# Patient Record
Sex: Male | Born: 1970 | Race: White | Hispanic: No | Marital: Single | Smoking: Former smoker
Health system: Southern US, Community
[De-identification: ages and names within clinical notes are randomized; demographics above are authoritative.]

## PROBLEM LIST (undated history)

## (undated) DIAGNOSIS — I10 Essential (primary) hypertension: Secondary | ICD-10-CM

## (undated) DIAGNOSIS — Z86718 Personal history of other venous thrombosis and embolism: Secondary | ICD-10-CM

---

## 2014-06-21 ENCOUNTER — Other Ambulatory Visit: Payer: Self-pay | Admitting: Orthopedic Surgery

## 2014-06-21 DIAGNOSIS — M545 Low back pain: Secondary | ICD-10-CM

## 2014-06-26 ENCOUNTER — Ambulatory Visit
Admission: RE | Admit: 2014-06-26 | Discharge: 2014-06-26 | Disposition: A | Discharge status: Home / Self Care | Payer: Worker's Compensation | Source: Ambulatory Visit | Attending: Orthopedic Surgery | Admitting: Orthopedic Surgery

## 2014-06-26 DIAGNOSIS — M545 Low back pain: Secondary | ICD-10-CM

## 2014-11-20 ENCOUNTER — Emergency Department (HOSPITAL_COMMUNITY): Payer: Worker's Compensation

## 2014-11-20 ENCOUNTER — Encounter (HOSPITAL_COMMUNITY): Payer: Self-pay | Admitting: Emergency Medicine

## 2014-11-20 ENCOUNTER — Inpatient Hospital Stay (HOSPITAL_COMMUNITY)
Admission: EM | Admit: 2014-11-20 | Discharge: 2014-11-22 | DRG: 176 | Disposition: A | Discharge status: Home / Self Care | Payer: Worker's Compensation | Attending: Internal Medicine | Admitting: Internal Medicine

## 2014-11-20 DIAGNOSIS — I82512 Chronic embolism and thrombosis of left femoral vein: Secondary | ICD-10-CM | POA: Diagnosis present

## 2014-11-20 DIAGNOSIS — F17210 Nicotine dependence, cigarettes, uncomplicated: Secondary | ICD-10-CM | POA: Diagnosis present

## 2014-11-20 DIAGNOSIS — F1910 Other psychoactive substance abuse, uncomplicated: Secondary | ICD-10-CM

## 2014-11-20 DIAGNOSIS — I10 Essential (primary) hypertension: Secondary | ICD-10-CM | POA: Diagnosis present

## 2014-11-20 DIAGNOSIS — Z7901 Long term (current) use of anticoagulants: Secondary | ICD-10-CM

## 2014-11-20 DIAGNOSIS — I82431 Acute embolism and thrombosis of right popliteal vein: Secondary | ICD-10-CM | POA: Diagnosis present

## 2014-11-20 DIAGNOSIS — G629 Polyneuropathy, unspecified: Secondary | ICD-10-CM | POA: Diagnosis present

## 2014-11-20 DIAGNOSIS — Z720 Tobacco use: Secondary | ICD-10-CM | POA: Diagnosis present

## 2014-11-20 DIAGNOSIS — I82403 Acute embolism and thrombosis of unspecified deep veins of lower extremity, bilateral: Secondary | ICD-10-CM

## 2014-11-20 DIAGNOSIS — I2699 Other pulmonary embolism without acute cor pulmonale: Secondary | ICD-10-CM

## 2014-11-20 DIAGNOSIS — I82409 Acute embolism and thrombosis of unspecified deep veins of unspecified lower extremity: Secondary | ICD-10-CM | POA: Diagnosis present

## 2014-11-20 DIAGNOSIS — I82411 Acute embolism and thrombosis of right femoral vein: Secondary | ICD-10-CM | POA: Diagnosis present

## 2014-11-20 DIAGNOSIS — I2692 Saddle embolus of pulmonary artery without acute cor pulmonale: Secondary | ICD-10-CM

## 2014-11-20 DIAGNOSIS — I82491 Acute embolism and thrombosis of other specified deep vein of right lower extremity: Secondary | ICD-10-CM | POA: Diagnosis present

## 2014-11-20 HISTORY — DX: Essential (primary) hypertension: I10

## 2014-11-20 HISTORY — DX: Personal history of other venous thrombosis and embolism: Z86.718

## 2014-11-20 LAB — D-DIMER, QUANTITATIVE (NOT AT ARMC): D DIMER QUANT: 15.35 ug{FEU}/mL — AB (ref 0.00–0.48)

## 2014-11-20 LAB — BASIC METABOLIC PANEL
ANION GAP: 13 (ref 5–15)
BUN: 7 mg/dL (ref 6–23)
CALCIUM: 8.3 mg/dL — AB (ref 8.4–10.5)
CO2: 20 mmol/L (ref 19–32)
Chloride: 106 mEq/L (ref 96–112)
Creatinine, Ser: 0.82 mg/dL (ref 0.50–1.35)
GFR calc non Af Amer: >90 mL/min (ref >90)
Glucose, Bld: 87 mg/dL (ref 70–99)
POTASSIUM: 3.6 mmol/L (ref 3.5–5.1)
Sodium: 139 mmol/L (ref 135–145)

## 2014-11-20 LAB — CBC
HCT: 41.6 % (ref 39.0–52.0)
Hemoglobin: 14.4 g/dL (ref 13.0–17.0)
MCH: 33.1 pg (ref 26.0–34.0)
MCHC: 34.6 g/dL (ref 30.0–36.0)
MCV: 95.6 fL (ref 78.0–100.0)
PLATELETS: 123 10*3/uL — AB (ref 150–400)
RBC: 4.35 MIL/uL (ref 4.22–5.81)
RDW: 14 % (ref 11.5–15.5)
WBC: 5.9 10*3/uL (ref 4.0–10.5)

## 2014-11-20 LAB — APTT: APTT: 28 s (ref 24–37)

## 2014-11-20 LAB — PROTIME-INR
INR: 0.97 (ref 0.00–1.49)
Prothrombin Time: 13 seconds (ref 11.6–15.2)

## 2014-11-20 MED ORDER — DOXAZOSIN MESYLATE 1 MG PO TABS
1.0000 mg | ORAL_TABLET | Freq: Every day | ORAL | Status: DC
  Administered 2014-11-20 – 2014-11-22 (×3): 1 mg via ORAL
  Filled 2014-11-20 (×3): qty 1

## 2014-11-20 MED ORDER — HEPARIN (PORCINE) IN NACL 100-0.45 UNIT/ML-% IJ SOLN
1450.0000 [IU]/h | INTRAMUSCULAR | Status: AC
  Administered 2014-11-20 (×2): 1300 [IU]/h via INTRAVENOUS
  Administered 2014-11-21: 1450 [IU]/h via INTRAVENOUS
  Filled 2014-11-20 (×6): qty 250

## 2014-11-20 MED ORDER — PREGABALIN 75 MG PO CAPS
150.0000 mg | ORAL_CAPSULE | Freq: Every day | ORAL | Status: DC
  Administered 2014-11-20 – 2014-11-21 (×2): 150 mg via ORAL
  Filled 2014-11-20 (×2): qty 2

## 2014-11-20 MED ORDER — SODIUM CHLORIDE 0.9 % IV SOLN: 250.0000 mL | INTRAVENOUS | Status: DC | PRN

## 2014-11-20 MED ORDER — SODIUM CHLORIDE 0.9 % IJ SOLN: 3.0000 mL | INTRAMUSCULAR | Status: DC | PRN

## 2014-11-20 MED ORDER — ACETAMINOPHEN 650 MG RE SUPP: 650.0000 mg | Freq: Four times a day (QID) | RECTAL | Status: DC | PRN

## 2014-11-20 MED ORDER — ACETAMINOPHEN 325 MG PO TABS: 650.0000 mg | ORAL_TABLET | Freq: Four times a day (QID) | ORAL | Status: DC | PRN

## 2014-11-20 MED ORDER — SODIUM CHLORIDE 0.9 % IJ SOLN
3.0000 mL | Freq: Two times a day (BID) | INTRAMUSCULAR | Status: DC
  Administered 2014-11-20 – 2014-11-22 (×3): 3 mL via INTRAVENOUS

## 2014-11-20 MED ORDER — ENOXAPARIN SODIUM 80 MG/0.8ML ~~LOC~~ SOLN
80.0000 mg | Freq: Two times a day (BID) | SUBCUTANEOUS | Status: DC
  Filled 2014-11-20: qty 0.8

## 2014-11-20 MED ORDER — INFLUENZA VAC SPLIT QUAD 0.5 ML IM SUSY: 0.5000 mL | PREFILLED_SYRINGE | INTRAMUSCULAR | Status: DC | PRN

## 2014-11-20 MED ORDER — IOHEXOL 350 MG/ML SOLN
100.0000 mL | Freq: Once | INTRAVENOUS | Status: AC | PRN
  Administered 2014-11-20: 100 mL via INTRAVENOUS

## 2014-11-20 MED ORDER — SODIUM CHLORIDE 0.9 % IJ SOLN
3.0000 mL | Freq: Two times a day (BID) | INTRAMUSCULAR | Status: DC
Start: 2014-11-20 — End: 2014-11-22
  Administered 2014-11-20 – 2014-11-22 (×2): 3 mL via INTRAVENOUS

## 2014-11-20 MED ORDER — ENOXAPARIN SODIUM 80 MG/0.8ML ~~LOC~~ SOLN
80.0000 mg | Freq: Once | SUBCUTANEOUS | Status: AC
  Administered 2014-11-20: 80 mg via SUBCUTANEOUS
  Filled 2014-11-20: qty 0.8

## 2014-11-20 MED ORDER — MORPHINE SULFATE 2 MG/ML IJ SOLN
2.0000 mg | INTRAMUSCULAR | Status: DC | PRN
  Administered 2014-11-20 – 2014-11-22 (×10): 2 mg via INTRAVENOUS
  Filled 2014-11-20 (×10): qty 1

## 2014-11-20 NOTE — Progress Notes (Signed)
ANTICOAGULATION CONSULT NOTE - Initial Consult  Pharmacy Consult for Heparin Indication: pulmonary embolus  No Known Allergies  Patient Measurements: Height: 6' (182.9 cm) Weight: 166 lb (75.297 kg) IBW/kg (Calculated) : 77.6 Heparin Dosing Weight: 75.3 kg  Vital Signs: Temp: 98.5 F (36.9 C) (12/22 1313) Temp Source: Oral (12/22 1313) BP: 138/83 mmHg (12/22 1230) Pulse Rate: 85 (12/22 1230)  Labs:  Recent Labs  11/20/14 1002  HGB 14.4  HCT 41.6  PLT 123*  APTT 28  LABPROT 13.0  INR 0.97  CREATININE 0.82    Estimated Creatinine Clearance: 121.2 mL/min (by C-G formula based on Cr of 0.82).   Medical History: Past Medical History  Diagnosis Date  . H/O blood clots   . Hypertension     stated that he has hx of HTN but they d/c the bp medication because BP was fine.     Medications:   (Not in a hospital admission) Scheduled:  . [START ON 11/21/2014] enoxaparin (LOVENOX) injection  80 mg Subcutaneous Q12H    Assessment: 43yo male with history of DVT in LLE presents with leg pain and SOB. Pharmacy is consulted to dose heparin for saddle pulmonary embolus. Pt received one dose of therapeutic lovenox at 1100 before the decision to transition to heparin. Hgb 14.4, Plt 123, sCr 0.82. There is no noted bleeding.  Goal of Therapy:  Heparin level 0.3-0.7 units/ml Monitor platelets by anticoagulation protocol: Yes   Plan:  Omit heparin bolus due to initial enoxaparin dose Start heparin infusion at 1300 units/hr Check anti-Xa level in 6 hours and daily while on heparin Continue to monitor H&H and platelets  Arlean Hoppingorey M. Newman PiesBall, PharmD Clinical Pharmacist Pager [phone] 11/20/2014,2:51 PM

## 2014-11-20 NOTE — ED Notes (Signed)
Patient transported to x-ray. ?

## 2014-11-20 NOTE — ED Provider Notes (Signed)
CSN: [id number]     Arrival date & time 11/20/14  0940 History   First MD Initiated Contact with Patient 11/20/14 [phone]     Chief Complaint  Patient presents with  . Leg Pain  . Leg Swelling  . Shortness of Breath     (Consider location/radiation/quality/duration/timing/severity/associated sxs/prior Treatment) HPI 43 year old male presents today complaining of right lower extremity swelling that began 3-4 days ago. He has a history of a DVT in the left lower extremity. He states this was diagnosed in May and that he was treated for 6 months with .  He states that this was stopped after 6 months but states that it was stopped in September, hence the dates are somewhat unclear. He states that that DVT began after he had an injury although he describes it as occurring the next day. He denies any family history of clots or prior history of clots. He states he does have some shortness of breath today with exertion. He denies any chest pain. He has not had any new injury to his lower extremities. Past Medical History  Diagnosis Date  . H/O blood clots   . Hypertension     stated that he has hx of HTN but they d/c the bp medication because BP was fine.    History reviewed. No pertinent past surgical history. No family history on file. History  Substance Use Topics  . Smoking status: Current Every Day Smoker -- 1.50 packs/day    Types: Cigarettes  . Smokeless tobacco: Not on file  . Alcohol Use: No    Review of Systems  All other systems reviewed and are negative.     Allergies  Review of patient's allergies indicates no known allergies.  Home Medications   Prior to Admission medications   Not on File   BP 160/95 mmHg  Pulse 95  Temp(Src) 98.9 F (37.2 C) (Oral)  Resp 16  SpO2 96% Physical Exam  Constitutional: He is oriented to person, place, and time. He appears well-developed and well-nourished.  HENT:  Head: Normocephalic and atraumatic.  Right Ear: External ear  normal.  Left Ear: External ear normal.  Nose: Nose normal.  Mouth/Throat: Oropharynx is clear and moist.  Eyes: Conjunctivae and EOM are normal. Pupils are equal, round, and reactive to light.  Neck: Normal range of motion. Neck supple.  Cardiovascular: Normal rate, regular rhythm, normal heart sounds and intact distal pulses.   Pulmonary/Chest: Effort normal and breath sounds normal. No respiratory distress. He has no wheezes. He exhibits no tenderness.  Abdominal: Soft. Bowel sounds are normal. He exhibits no distension and no mass. There is no tenderness. There is no guarding.  Musculoskeletal: Normal range of motion.       Legs: rle swelling   Neurological: He is alert and oriented to person, place, and time. He has normal reflexes. He exhibits normal muscle tone. Coordination normal.  Skin: Skin is warm and dry.  Psychiatric: He has a normal mood and affect. His behavior is normal. Judgment and thought content normal.  Nursing note and vitals reviewed.   ED Course  Procedures (including critical care time) Labs Review Labs Reviewed  BASIC METABOLIC PANEL - Abnormal; Notable for the following:    Calcium 8.3 (*)    All other components within normal limits  CBC - Abnormal; Notable for the following:    Platelets 123 (*)    All other components within normal limits  D-DIMER, QUANTITATIVE - Abnormal; Notable for the following:  D-Dimer, Quant 15.35 (*)    All other components within normal limits  APTT  PROTIME-INR    Imaging Review No results found.   EKG Interpretation   Date/Time:  Tuesday November 20 2014 10:51:47 EST Ventricular Rate:  80 PR Interval:  149 QRS Duration: 90 QT Interval:  363 QTC Calculation: 419 R Axis:   69 Text Interpretation:  Normal sinus rhythm Normal ECG Confirmed by Kajsa Butrum MD,  Duwayne HeckANIELLE ([id number]) on 11/20/2014 11:58:20 AM      MDM   Final diagnoses:  Saddle pulmonary embolus  DVT (deep venous thrombosis), bilateral  Bilateral  pulmonary embolism    CT chest report called to me by radiologist who reports bilateral PE with right heart strain noted. Reexamination of the patient reveals patient awake and alert without dyspnea at rest. Heart rate is 85 respiratory rate 17 blood pressure 138/83. Patient had received Lovenox 1 mg/kg on my initial evaluation for probable DVT. Care discussed with pharmacy.  Patient placed on oxygen.  Page to critical care placed.  I discussed patient's care with Dr. Molli KnockYacoub on call for critical care. He has reviewed the CT and feels that patient is candidate for telemetry bed.  Care discussed with Dr. Catha GosselinMikhail and plan admission.   Hilario Quarryanielle S Nikitha Mode, MD 11/26/14 [phone]

## 2014-11-20 NOTE — H&P (Signed)
[facility] History and Physical  Legrande Hao WUJ:[id number] DOB: 1971-02-08 DOA: 11/20/2014  Referring physician: Dr. Margarita Grizzle  PCP: No primary care provider on file.  Specialists:   Chief Complaint: Leg pain and shortness of breath  HPI: Walter Mccarty is a 43 y.o. 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 Patient had an injury in May 2015 at which point he had gone to [facility] and was found to have a left lower extremity DVT. Patient was placed on Xarelto which was discontinued in September 2015. Upon arrival to the emergency department, patient did not complain of chest pain.  He did complain of shortness of breath with exertion which began today. Patient denies any new injury, falls, recent travel. He does state for the past several months he has been more sedentary than usual.  CT chest done in the ER shows acute saddle pulmonary emboli. PCCM was called, however as patient is currently hemodynamically stable, admission was deferred to [facility].  Review of Systems:  Constitutional: Denies fever, chills, diaphoresis, appetite change and fatigue.  HEENT: Denies photophobia, eye pain, redness, hearing loss, ear pain, congestion, sore throat, rhinorrhea, sneezing, mouth sores, trouble swallowing, neck pain, neck stiffness and tinnitus.   Respiratory: Complains of some shortness of breath.   Cardiovascular: Denies chest pain, palpitations. Complains of right leg swelling.  Gastrointestinal: Denies nausea, vomiting, abdominal pain, diarrhea, constipation, blood in stool and abdominal distention.  Genitourinary: Denies dysuria, urgency, frequency, hematuria, flank pain and difficulty urinating.  Musculoskeletal: Complains of back pain and leg swelling.  Skin: Denies pallor, rash and wound.  Neurological: Denies  dizziness, seizures, syncope, weakness, light-headedness, numbness and headaches.  Hematological: Denies adenopathy. Easy bruising, personal or family bleeding history  Psychiatric/Behavioral: Denies suicidal ideation, mood changes, confusion, nervousness, sleep disturbance and agitation  Past Medical History  Diagnosis Date  . H/O blood clots   . Hypertension     stated that he has hx of HTN but they d/c the bp medication because BP was fine.    History reviewed. No pertinent past surgical history. Social History:  reports that he has been smoking Cigarettes.  He has been smoking about 1.50 packs per day. He does not have any smokeless tobacco history on file. He reports that he does not drink alcohol or use illicit drugs.   No Known Allergies  Family history Reviewed and not pertinent   Prior to Admission medications   Medication Sig Start Date End Date Taking? Authorizing Provider  doxazosin (CARDURA) 1 MG tablet Take 1 mg by mouth daily. 10/26/14  Yes Historical Provider, MD  LYRICA 75 MG capsule Take 150 mg by mouth at bedtime. 10/26/14  Yes Historical Provider, MD   Physical Exam: Filed Vitals:   11/20/14 1313  BP:   Pulse:   Temp: 98.5 F (36.9 C)  Resp:      General: Well developed, well nourished, NAD, appears stated age  HEENT: NCAT, PERRLA, EOMI, Anicteic Sclera, mucous membranes moist.   Neck: Supple, no JVD, no masses  Cardiovascular: S1 S2 auscultated, no rubs, murmurs or gallops. Regular rate and rhythm.  Respiratory: Clear to auscultation bilaterally with equal chest rise  Abdomen: Soft, nontender, nondistended, + bowel sounds  Extremities: warm dry without cyanosis clubbing. RLE edema and erythema, warm and tender to palpation  Neuro: AAOx3, cranial nerves grossly intact. Strength  equal and bilateral in patient's upper and lower extremities  Skin: Without rashes exudates or nodules  Psych: Anxious, but appropriate  Labs on Admission:  Basic  Metabolic Panel:  Recent Labs Lab 11/20/14 1002  NA 139  K 3.6  CL 106  CO2 20  GLUCOSE 87  BUN 7  CREATININE 0.82  CALCIUM 8.3*   Liver Function Tests: No results for input(s): AST, ALT, ALKPHOS, BILITOT, PROT, ALBUMIN in the last 168 hours. No results for input(s): LIPASE, AMYLASE in the last 168 hours. No results for input(s): AMMONIA in the last 168 hours. CBC:  Recent Labs Lab 11/20/14 1002  WBC 5.9  HGB 14.4  HCT 41.6  MCV 95.6  PLT 123*   Cardiac Enzymes: No results for input(s): CKTOTAL, CKMB, CKMBINDEX, TROPONINI in the last 168 hours.  BNP (last 3 results) No results for input(s): PROBNP in the last 8760 hours. CBG: No results for input(s): GLUCAP in the last 168 hours.  Radiological Exams on Admission: Ct Angio Chest Pe W/cm &/or Wo Cm  11/20/2014   CLINICAL DATA:  Right upper chest pain, severe right leg pain. Patient reports recent vascular study with DVT in right leg. History of left leg DVT May 2015.  EXAM: CT ANGIOGRAPHY CHEST WITH CONTRAST  TECHNIQUE: Multidetector CT imaging of the chest was performed using the standard protocol during bolus administration of intravenous contrast. Multiplanar CT image reconstructions and MIPs were obtained to evaluate the vascular anatomy.  CONTRAST:  100mL OMNIPAQUE IOHEXOL 350 MG/ML SOLN  COMPARISON:  None available.  FINDINGS: The examination is positive for pulmonary embolus. There is a thin saddle component with large thromboembolic burden in the distal main right pulmonary artery extending into the lower lobe and all segmental branches. Clot extends into the right upper lobar and to a lesser extent segmental branches. There is extension into the right middle lobe and medial segmental branch. On the left there is moderate thromboembolic burden involving the left upper and lower lobar arteries as well as all segmental branches. There is evidence of right heart strain with straightening of the intraventricular septum and  RV/ LV ratio of 1.5. No consolidation in the lung parenchyma to suggest pulmonary infarct.  The lungs are clear. There is no pleural or pericardial effusion. No enlarged mediastinal, hilar, or axillary lymph nodes. The thoracic aorta is normal in caliber. Minimal atherosclerotic calcifications in the proximal coronary arteries.  Evaluation of the upper abdomen demonstrates diffusely decreased hepatic density consistent with hepatic steatosis. No acute abnormality in the included upper abdomen.  No acute or suspicious osseous abnormality.  Review of the MIP images confirms the above findings.  IMPRESSION: 1. Positive for acute PE with CT evidence of right heartstrain (RV/LV Ratio = 1.5) consistent with at least submassive (intermediate risk)PE. The presence of right heart strain has been associated with an increased risk of morbidity and mortality. Consultation with Pulmonary and Critical Care Medicine is recommended. 2. Incidental finding of hepatic steatosis.  Critical Value/emergent results were called by telephone at the time of interpretation on 11/20/2014 at 12:52 pm to Dr. Margarita GrizzleANIELLE RAY , who verbally acknowledged these results.   Electronically Signed   By: Rubye OaksMelanie  Ehinger M.D.   On: 11/20/2014 13:02    EKG: Independently reviewed. SR, rate 80  Assessment/Plan  Acute pulmonary embolism/Saddle -Patient be admitted to telemetry -Patient currently hemodynamically stable, blood pressure is stable -history of Left DVT due to injury, was on xarelto for 3 months (discontinued in Sep 2015) -CT chest:  Positive for acute PE with evidence of right heart strain consistent with at least some massive PE; thin saddle component with large thromboembolic burden in distal main right PA -PCCM contacted and felt patient to be stable for telemetry -Echocardiogram ordered -Patient given Lovenox 80mg  SQ by ED -Will place patient on heparin gtt -Lower extremity Doppler: Right: Acute DVT noted in the CFV, FV, Pop v,  gastrocnemius v, and proximal PTV and Pero v. Superficial thrombosis noted in the proximal LSV. 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 -Explained to patient and mother that he will need lifelong anticoagulation  Nicotine abuse -Smoking cessation counseling given  Neuropathy -Continue lyrica  Hypertension -Continue cardura, with holding parameters  DVT prophylaxis: Heparin  Code Status: Full  Condition: Guarded  Family Communication: Mother at bedside. Admission, patients condition and plan of care including tests being ordered have been discussed with the patient and mother who indicate understanding and agree with the plan and Code Status.  Disposition Plan: Admitted   Time spent: 65 minutes  Ector Laurel D.O. [facility] Pager [phone]  If 7PM-7AM, please contact night-coverage [email or web address] Password [email or web address] 11/20/2014, 2:17 PM

## 2014-11-20 NOTE — Progress Notes (Signed)
VASCULAR LAB PRELIMINARY  PRELIMINARY  PRELIMINARY  PRELIMINARY  Bilateral lower extremity venous duplex  completed.    Preliminary report:  Right:  Acute DVT noted in the CFV, FV, Pop v, gastrocnemius v, and proximal PTV and Pero v.  Superficial thrombosis noted in the proximal LSV.  No Baker's cyst.  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  No evidence of superficial thrombosis.  No Baker's cyst.   Gabe Glace, RVT 11/20/2014, 12:31 PM

## 2014-11-20 NOTE — Progress Notes (Signed)
ANTICOAGULATION CONSULT NOTE - Initial Consult  Pharmacy Consult for lovenox Indication: DVT  No Known Allergies  Patient Measurements:    Dosing Weight: 75.3 kg  Vital Signs: Temp: 98.9 F (37.2 C) (12/22 0958) Temp Source: Oral (12/22 0958) BP: 160/95 mmHg (12/22 0958) Pulse Rate: 95 (12/22 0958)  Labs:  Recent Labs  11/20/14 1002  HGB 14.4  HCT 41.6  PLT 123*    CrCl cannot be calculated (Unknown ideal weight.).   Medical History: Past Medical History  Diagnosis Date  . H/O blood clots   . Hypertension     stated that he has hx of HTN but they d/c the bp medication because BP was fine.     Medications:  See medication history  Assessment: 43 yo man to start lovenox for DVT.  CrCl >100 ml/min.  Baseline Hg okay, PTLC low at 123 Goal of Therapy:  Anti-Xa level 0.6-1 units/ml 4hrs after LMWH dose given Monitor platelets by anticoagulation protocol: Yes   Plan:  Lovenox 80 mg sq q12 hours CBC q 3 days while on lovenox Monitor for bleeding complications. F/u start oral AC.  Thanks for allowing pharmacy to be a part of this patient's care.  Talbert CageLora Paytin Ramakrishnan, PharmD Clinical Pharmacist, [phone]  11/20/2014,10:37 AM

## 2014-11-20 NOTE — ED Notes (Addendum)
Pt arrived to ED with c/o right leg pain swelling and SOB with exertion. Pt has hx of blood clot to left leg. Pt stated that right leg hurts from ankle all the way up to groin.

## 2014-11-21 DIAGNOSIS — I517 Cardiomegaly: Secondary | ICD-10-CM

## 2014-11-21 DIAGNOSIS — I82403 Acute embolism and thrombosis of unspecified deep veins of lower extremity, bilateral: Secondary | ICD-10-CM

## 2014-11-21 LAB — CBC
HCT: 39.2 % (ref 39.0–52.0)
Hemoglobin: 13.1 g/dL (ref 13.0–17.0)
MCH: 32.2 pg (ref 26.0–34.0)
MCHC: 33.4 g/dL (ref 30.0–36.0)
MCV: 96.3 fL (ref 78.0–100.0)
Platelets: 129 10*3/uL — ABNORMAL LOW (ref 150–400)
RBC: 4.07 MIL/uL — ABNORMAL LOW (ref 4.22–5.81)
RDW: 14.2 % (ref 11.5–15.5)
WBC: 6.6 10*3/uL (ref 4.0–10.5)

## 2014-11-21 LAB — HEPARIN LEVEL (UNFRACTIONATED)
Heparin Unfractionated: 0.32 IU/mL (ref 0.30–0.70)
Heparin Unfractionated: 0.34 IU/mL (ref 0.30–0.70)

## 2014-11-21 NOTE — Care Management Note (Signed)
    Page 1 of 1   11/21/2014     11:45:51 AM CARE MANAGEMENT NOTE 11/21/2014  Patient:  Walter Mccarty,Walter Mccarty   Account Number:  [id number]  Date Initiated:  11/21/2014  Documentation initiated by:  Donn PieriniWEBSTER,Terianna Peggs  Subjective/Objective Assessment:   Pt admitted with DVT and PE     Action/Plan:   PTA pt lived at home with mother   Anticipated DC Date:  11/23/2014   Anticipated DC Plan:  HOME/SELF CARE      DC Planning Services  CM consult  Medication Assistance  [facility]      Choice offered to / List presented to:             Status of service:  In process, will continue to follow Medicare Important Message given?   (If response is "NO", the following Medicare IM given date fields will be blank) Date Medicare IM given:   Medicare IM given by:   Date Additional Medicare IM given:   Additional Medicare IM given by:    Discharge Disposition:    Per UR Regulation:  Reviewed for med. necessity/level of care/duration of stay  If discussed at Long Length of Stay Meetings, dates discussed:    Comments:  11/21/14- 1100- Donn PieriniKristi Emanual Lamountain RN, BSN [phone] Pt to start on Xarelto- per conversation with pt and mother- pt was on Xarelto previously and was taken off after 3 mo- per pt he was covered for Xarelto under his Worker's Comp. and did not use assistance under Xarelto drug company- he states he did not use the 30 day free card when he was placed on it previously - so can use the 30 day free card at time of discharge - card given to pt for use- pt also given paperwork for pt assistance for Xarelto through [employer]- pt [facility] - and faxing to see if he is eligible for pt assistance- [facility] f/u appointment made for Monday 11/26/14 at 9:00- pt has info on clinic- and understands he will need $20 copay for visit. He will f/u with clinic for establishing PCP there.

## 2014-11-21 NOTE — Progress Notes (Signed)
*  PRELIMINARY RESULTS* Echocardiogram 2D Echocardiogram has been performed.  Jeryl ColumbiaLLIOTT, Telesforo Brosnahan 11/21/2014, 9:54 AM

## 2014-11-21 NOTE — Progress Notes (Signed)
ANTICOAGULATION CONSULT NOTE - Follow Up Consult  Pharmacy Consult for Heparin Indication: New RLE DVT and acute saddle PE  No Known Allergies  Patient Measurements: Height: 6' (182.9 cm) Weight: 170 lb (77.111 kg) IBW/kg (Calculated) : 77.6 Heparin Dosing Weight: 77 kg  Vital Signs: Temp: 97.8 F (36.6 C) (12/23 0447) Temp Source: Oral (12/23 0447) BP: 121/80 mmHg (12/23 0447) Pulse Rate: 79 (12/23 0447)  Labs:  Recent Labs  11/20/14 1002 11/21/14 0256  HGB 14.4 13.1  HCT 41.6 39.2  PLT 123* 129*  APTT 28  --   LABPROT 13.0  --   INR 0.97  --   HEPARINUNFRC  --  0.32  CREATININE 0.82  --     Estimated Creatinine Clearance: 124.1 mL/min (by C-G formula based on Cr of 0.82).   Medications:  Heparin @ 1400 units/hr (14 ml/hr)  Assessment: 89 YOM with recent DVT diagnosed in May '15 treated with Xarelto who presented on 12/22 with a new DVT and acute saddle PE. Pharmacy on board to dose heparin for anticoagulation.  A heparin level this morning remained therapeutic however still on the lower end of the range (HL 0.34 << 0.32, goal of 0.3-0.7). Hgb/Hct wnl, plts 129 << 123. No overt s/sx of bleeding noted. Will increase slightly to keep within range and recheck a HL in the AM.  Goal of Therapy:  Heparin level 0.3-0.7 units/ml Monitor platelets by anticoagulation protocol: Yes   Plan:  1. Increase heparin to 1450 units/hr (14.5 ml/hr) 2. Will continue to monitor for any signs/symptoms of bleeding and will follow up with heparin level in the a.m.   Georgina PillionElizabeth Adana Marik, PharmD, BCPS Clinical Pharmacist Pager: [phone] 11/21/2014 11:21 AM

## 2014-11-21 NOTE — Progress Notes (Signed)
UR Completed.  [phone]  

## 2014-11-21 NOTE — Plan of Care (Signed)
Problem: Phase I Progression Outcomes Goal: Pain controlled with appropriate interventions Outcome: Progressing Pt request morphine q4h.

## 2014-11-21 NOTE — Progress Notes (Signed)
ANTICOAGULATION CONSULT NOTE - Follow Up Consult  Pharmacy Consult for heparin Indication: pulmonary embolus  Labs:  Recent Labs  11/20/14 1002 11/21/14 0256  HGB 14.4 13.1  HCT 41.6 39.2  PLT 123* 129*  APTT 28  --   LABPROT 13.0  --   INR 0.97  --   HEPARINUNFRC  --  0.32  CREATININE 0.82  --      Assessment: 43yo male therapeutic on heparin though at low end of goal, would prefer higher level w/ PE.  Goal of Therapy:  Heparin level 0.3-0.7 units/ml   Plan:  Will increase heparin gtt slightly to 1400 units/hr and check level in 6hr.  Vernard GamblesVeronda Reginal Wojcicki, PharmD, BCPS  11/21/2014,4:06 AM

## 2014-11-21 NOTE — Progress Notes (Signed)
[facility]                                                                              Patient Demographics  Walter LeydenJoel Mccarty, is a 43 y.o. male, DOB - 01/23/1971, ZOX:[id number]  Admit date - 11/20/2014   Admitting Physician Walter PetrinMaryann Laquandra Carrillo, DO  Outpatient Primary MD for the patient is No primary care provider on file.  LOS - 1   Chief Complaint  Patient presents with  . Leg Pain  . Leg Swelling  . Shortness of Breath        Assessment & Plan   Acute pulmonary embolism/Saddle -Patient currently hemodynamically stable, blood pressure is stable -history of Left DVT due to injury, was on xarelto for 3 months (discontinued in Sep 2015) -CT chest: Positive for acute PE with evidence of right heart strain consistent with at least some massive PE; thin saddle component with large thromboembolic burden in distal main right PA -PCCM contacted and felt patient to be stable for telemetry -Echocardiogram: EF 55-60%, diastolic function parameters normal -Patient given Lovenox 80mg  SQ by ED -Continue heparin gtt -Lower extremity Doppler: Right: Acute DVT noted in the CFV, FV, Pop v, gastrocnemius v, and proximal PTV and Pero v. Superficial thrombosis noted in the proximal LSV. 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 -Explained to patient and mother that he will need lifelong anticoagulation  Nicotine abuse -Smoking cessation counseling given  Neuropathy -Continue lyrica  Hypertension -Continue cardura, with holding parameters  Code Status: Full  Family Communication: None at bedside  Disposition Plan: Admitted, expect discharge within 24-48 hours  Time Spent in minutes   30 minutes  Procedures  Echocardiogram: EF 55-60%, diastolic function normal  Consults   PCCM  DVT Prophylaxis  Heparin  Lab Results  Component Value Date   PLT 129* 11/21/2014    Medications  Scheduled Meds: . doxazosin  1 mg Oral Daily  . pregabalin  150 mg Oral QHS  . sodium chloride  3 mL  Intravenous Q12H  . sodium chloride  3 mL Intravenous Q12H   Continuous Infusions: . heparin 1,450 Units/hr (11/21/14 1142)   PRN Meds:.sodium chloride, acetaminophen **OR** acetaminophen, Influenza vac split quadrivalent PF, morphine injection, sodium chloride  Antibiotics    Anti-infectives    None     Subjective:   Walter LeydenJoel Mccarty seen and examined today. Patient states he does not shortness of breath as long as he does not move.  He denies chest pain.  He does complain of continued leg pain, but does feel it has improved.   Objective:   Filed Vitals:   11/20/14 1529 11/20/14 2012 11/21/14 0447 11/21/14 1007  BP: 132/96 137/89 121/80 129/82  Pulse: 80 79 79   Temp: 98.4 F (36.9 C) 98.7 F (37.1 C) 97.8 F (36.6 C)   TempSrc: Oral Oral Oral   Resp: 18 18 18    Height: 6' (1.829 m)     Weight: 77.111 kg (170 lb)     SpO2: 100% 95% 95%     Wt Readings from Last 3 Encounters:  11/20/14 77.111 kg (170 lb)     Intake/Output Summary (Last 24 hours) at 11/21/14 1331 Last data filed  at 11/21/14 1011  Gross per 24 hour  Intake      3 ml  Output   1050 ml  Net  -1047 ml    Exam  General: Well developed, well nourished, NAD, appears stated age  HEENT: NCAT, mucous membranes moist.   Cardiovascular: S1 S2 auscultated, no rubs, murmurs or gallops. Regular rate and rhythm.  Respiratory: Clear to auscultation bilaterally with equal chest rise  Abdomen: Soft, nontender, nondistended, + bowel sounds  Extremities: warm dry without cyanosis clubbing. Edema RLE, with mild erythema and warmth, some TTP  Neuro: AAOx3, no focal deficits  Skin: Without rashes exudates or nodules  Psych: Normal affect and demeanor with intact judgement and insight  Data Review   Micro Results No results found for this or any previous visit (from the past 240 hour(s)).  Radiology Reports Ct Angio Chest Pe W/cm &/or Wo Cm  11/20/2014   CLINICAL DATA:  Right upper chest pain, severe  right leg pain. Patient reports recent vascular study with DVT in right leg. History of left leg DVT May 2015.  EXAM: CT ANGIOGRAPHY CHEST WITH CONTRAST  TECHNIQUE: Multidetector CT imaging of the chest was performed using the standard protocol during bolus administration of intravenous contrast. Multiplanar CT image reconstructions and MIPs were obtained to evaluate the vascular anatomy.  CONTRAST:  OMNIPAQUE IOHEXOL 350 MG/ML SOLN  COMPARISON:  None available.  FINDINGS: The examination is positive for pulmonary embolus. There is a thin saddle component with large thromboembolic burden in the distal main right pulmonary artery extending into the lower lobe and all segmental branches. Clot extends into the right upper lobar and to a lesser extent segmental branches. There is extension into the right middle lobe and medial segmental branch. On the left there is moderate thromboembolic burden involving the left upper and lower lobar arteries as well as all segmental branches. There is evidence of right heart strain with straightening of the intraventricular septum and RV/ LV ratio of 1.5. No consolidation in the lung parenchyma to suggest pulmonary infarct.  The lungs are clear. There is no pleural or pericardial effusion. No enlarged mediastinal, hilar, or axillary lymph nodes. The thoracic aorta is normal in caliber. Minimal atherosclerotic calcifications in the proximal coronary arteries.  Evaluation of the upper abdomen demonstrates diffusely decreased hepatic density consistent with hepatic steatosis. No acute abnormality in the included upper abdomen.  No acute or suspicious osseous abnormality.  Review of the MIP images confirms the above findings.  IMPRESSION: 1. Positive for acute PE with CT evidence of right heartstrain (RV/LV Ratio = 1.5) consistent with at least submassive (intermediate risk)PE. The presence of right heart strain has been associated with an increased risk of morbidity and mortality.  Consultation with Pulmonary and Critical Care Medicine is recommended. 2. Incidental finding of hepatic steatosis.  Critical Value/emergent results were called by telephone at the time of interpretation on 11/20/2014 at 12:52 pm to Dr. Margarita Grizzle , who verbally acknowledged these results.   Electronically Signed   By: Rubye Oaks M.D.   On: 11/20/2014 13:02    CBC  Recent Labs Lab 11/20/14 1002 11/21/14 0256  WBC 5.9 6.6  HGB 14.4 13.1  HCT 41.6 39.2  PLT 123* 129*  MCV 95.6 96.3  MCH 33.1 32.2  MCHC 34.6 33.4  RDW 14.0 14.2    Chemistries   Recent Labs Lab 11/20/14 1002  NA 139  K 3.6  CL 106  CO2 20  GLUCOSE 87  BUN  7  CREATININE 0.82  CALCIUM 8.3*   ------------------------------------------------------------------------------------------------------------------ estimated creatinine clearance is 124.1 mL/min (by C-G formula based on Cr of 0.82). ------------------------------------------------------------------------------------------------------------------ No results for input(s): HGBA1C in the last 72 hours. ------------------------------------------------------------------------------------------------------------------ No results for input(s): CHOL, HDL, LDLCALC, TRIG, CHOLHDL, LDLDIRECT in the last 72 hours. ------------------------------------------------------------------------------------------------------------------ No results for input(s): TSH, T4TOTAL, T3FREE, THYROIDAB in the last 72 hours.  Invalid input(s): FREET3 ------------------------------------------------------------------------------------------------------------------ No results for input(s): VITAMINB12, FOLATE, FERRITIN, TIBC, IRON, RETICCTPCT in the last 72 hours.  Coagulation profile  Recent Labs Lab 11/20/14 1002  INR 0.97     Recent Labs  11/20/14 1002  DDIMER 15.35*    Cardiac Enzymes No results for input(s): CKMB, TROPONINI, MYOGLOBIN in the last 168  hours.  Invalid input(s): CK ------------------------------------------------------------------------------------------------------------------ Invalid input(s): POCBNP    Walter Mccarty D.O. on 11/21/2014 at 1:31 PM  Between 7am to 7pm - Pager - [phone]  After 7pm go to [email or web address] - password [email or web address]  And look for the night coverage person covering for me after hours  [facility] Office  [phone]

## 2014-11-22 LAB — HEPARIN LEVEL (UNFRACTIONATED): Heparin Unfractionated: 0.36 IU/mL (ref 0.30–0.70)

## 2014-11-22 LAB — CBC
HCT: 37.7 % — ABNORMAL LOW (ref 39.0–52.0)
Hemoglobin: 12.7 g/dL — ABNORMAL LOW (ref 13.0–17.0)
MCH: 32.5 pg (ref 26.0–34.0)
MCHC: 33.7 g/dL (ref 30.0–36.0)
MCV: 96.4 fL (ref 78.0–100.0)
PLATELETS: 124 10*3/uL — AB (ref 150–400)
RBC: 3.91 MIL/uL — ABNORMAL LOW (ref 4.22–5.81)
RDW: 14.2 % (ref 11.5–15.5)
WBC: 5.9 10*3/uL (ref 4.0–10.5)

## 2014-11-22 MED ORDER — RIVAROXABAN 15 MG PO TABS
15.0000 mg | ORAL_TABLET | Freq: Two times a day (BID) | ORAL | Status: DC
  Administered 2014-11-22: 15 mg via ORAL
  Filled 2014-11-22 (×3): qty 1

## 2014-11-22 MED ORDER — RIVAROXABAN (XARELTO) VTE STARTER PACK (15 & 20 MG): ORAL_TABLET | ORAL | Status: AC

## 2014-11-22 MED ORDER — RIVAROXABAN 20 MG PO TABS: 20.0000 mg | ORAL_TABLET | Freq: Every day | ORAL | Status: DC

## 2014-11-22 MED ORDER — HYDROCODONE-ACETAMINOPHEN 5-325 MG PO TABS
1.0000 | ORAL_TABLET | ORAL | Status: DC | PRN
  Administered 2014-11-22: 1 via ORAL
  Filled 2014-11-22: qty 1

## 2014-11-22 MED ORDER — RIVAROXABAN 15 MG PO TABS
15.0000 mg | ORAL_TABLET | Freq: Two times a day (BID) | ORAL | Status: DC
  Filled 2014-11-22 (×2): qty 1

## 2014-11-22 NOTE — Progress Notes (Signed)
Pt discharged  Discharge instructions given & reviewed Education discussed  IV dc'd  Tele dc'd  Pt refused flu and pna vaccines Pt discharged via wheelchair with RN, all pt belongs at side Louie BunWilson,Alliene Klugh S 2:08 PM

## 2014-11-22 NOTE — Progress Notes (Signed)
Per pt MD inquired about him wanting to be discharged today via phone, pt agreeable, MD text paged to make aware. Walter Mccarty,Latronda Spink S  12:54 PM

## 2014-11-22 NOTE — Progress Notes (Signed)
ANTICOAGULATION CONSULT NOTE - Follow Up Consult  Pharmacy Consult for Heparin >> Xarelto Indication: New RLE DVT and acute saddle PE  No Known Allergies  Patient Measurements: Height: 6' (182.9 cm) Weight: 170 lb (77.111 kg) IBW/kg (Calculated) : 77.6 Heparin Dosing Weight: 77 kg  Vital Signs: Temp: 98.4 F (36.9 C) (12/24 0440) Temp Source: Oral (12/24 0440) BP: 121/81 mmHg (12/24 0440) Pulse Rate: 69 (12/24 0440)  Labs:  Recent Labs  11/20/14 1002 11/21/14 0256 11/21/14 1003 11/22/14 0512  HGB 14.4 13.1  --  12.7*  HCT 41.6 39.2  --  37.7*  PLT 123* 129*  --  124*  APTT 28  --   --   --   LABPROT 13.0  --   --   --   INR 0.97  --   --   --   HEPARINUNFRC  --  0.32 0.34 0.36  CREATININE 0.82  --   --   --     Estimated Creatinine Clearance: 124.1 mL/min (by C-G formula based on Cr of 0.82).   Medications:  Heparin @ 1450 units/hr (14.5 ml/hr)  Assessment: [age] YOM with recent DVT diagnosed in May '15 treated with Xarelto who presented on 12/22 with a new DVT and acute saddle PE.   Pharmacy has been consulted this morning to transition the patient from heparin to Xarelto for continued outpatient treatment of the new PE/DVT. Xarelto should be started at the time the heparin drip is stopped - this was discussed with the RN.   Hgb/Hct/Plt slight drop this AM - no overt s/sx of bleeding noted.   The patient was educated on Xarelto today.  Goal of Therapy:  Appropriate anticoagulation for indication and renal/hepatic function   Plan:  1. Discontinue Heparin this morning 2. At time of heparin d/c, start Xarelto 15 mg bidwc x 21 days (12/24 >> 1/13)  followed by Xarelto 20 mg daily with supper (starting on 1/14) 3. Will continue to monitor for tolerance, bleeding, need for dose adjustments  Georgina PillionElizabeth Daisa Stennis, PharmD, BCPS Clinical Pharmacist Pager: [phone] 11/22/2014 8:18 AM
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
## 2014-11-22 NOTE — Discharge Instructions (Signed)
Information on my medicine - XARELTO® (rivaroxaban) ° °This medication education was reviewed with me or my healthcare representative as part of my discharge preparation.  The pharmacist that spoke with me during my hospital stay was:  Carvell Hoeffner Ann, RPH ° °WHY WAS XARELTO® PRESCRIBED FOR YOU? °Xarelto® was prescribed to treat blood clots that may have been found in the veins of your legs (deep vein thrombosis) or in your lungs (pulmonary embolism) and to reduce the risk of them occurring again. ° °What do you need to know about Xarelto®? °The starting dose is one 15 mg tablet taken TWICE daily with food for the FIRST 21 DAYS then on January 14th, 2016  the dose is changed to one 20 mg tablet taken ONCE A DAY with your evening meal. ° °DO NOT stop taking Xarelto® without talking to the health care provider who prescribed the medication.  Refill your prescription for 20 mg tablets before you run out. ° °After discharge, you should have regular check-up appointments with your healthcare provider that is prescribing your Xarelto®.  In the future your dose may need to be changed if your kidney function changes by a significant amount. ° °What do you do if you miss a dose? °If you are taking Xarelto® TWICE DAILY and you miss a dose, take it as soon as you remember. You may take two 15 mg tablets (total 30 mg) at the same time then resume your regularly scheduled 15 mg twice daily the next day. ° °If you are taking Xarelto® ONCE DAILY and you miss a dose, take it as soon as you remember on the same day then continue your regularly scheduled once daily regimen the next day. Do not take two doses of Xarelto® at the same time.  ° °Important Safety Information °Xarelto® is a blood thinner medicine that can cause bleeding. You should call your healthcare provider right away if you experience any of the following: °? Bleeding from an injury or your nose that does not stop. °? Unusual colored urine (red or dark brown) or  unusual colored stools (red or black). °? Unusual bruising for unknown reasons. °? A serious fall or if you hit your head (even if there is no bleeding). ° °Some medicines may interact with Xarelto® and might increase your risk of bleeding while on Xarelto®. To help avoid this, consult your healthcare provider or pharmacist prior to using any new prescription or non-prescription medications, including herbals, vitamins, non-steroidal anti-inflammatory drugs (NSAIDs) and supplements. ° °This website has more information on Xarelto®: www.xarelto.com. °

## 2014-11-22 NOTE — Discharge Summary (Signed)
Physician Discharge Summary  Walter Mccarty ZHY:[id number] DOB: 07-23-1971 DOA: 11/20/2014  PCP: No primary care provider on file.  Admit date: 11/20/2014 Discharge date: 11/22/2014  Time spent: 45 minutes  Recommendations for Outpatient Follow-up:  Patient will be discharged to home. Continue his medications as prescribed. Patient was strongly advised to stop smoking. [facility] will [facility]. Patient should follow a heart healthy diet. Patient may resume activity as tolerated. Patient should seek physical therapy as an outpatient.  Discharge Diagnoses:  Principal Problem:   Acute pulmonary embolism Active Problems:   Saddle pulmonary embolus   Nicotine abuse   Essential hypertension   Neuropathy   DVT (deep venous thrombosis)   Discharge Condition: Stable  Diet recommendation: Heart healthy  Filed Weights   11/20/14 1049 11/20/14 1529  Weight: 75.297 kg (166 lb) 77.111 kg (170 lb)    History of present illness:  On 11/20/2014 Walter Mccarty is a 43 y.o. 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 Patient had an injury in May 2015 at which point he had gone to [facility] and was found to have a left lower extremity DVT. Patient was placed on Xarelto which was discontinued in September 2015. Upon arrival to the emergency department, patient did not complain of chest pain. He did complain of shortness of breath with exertion which began today. Patient denies any new injury, falls, recent travel. He does state for the past several months he has been more sedentary than usual. CT chest done in the ER shows acute saddle pulmonary emboli. PCCM was called, however as patient is currently hemodynamically stable, admission was deferred to [facility].  Hospital Course:  Acute  pulmonary embolism/Saddle -Patient currently hemodynamically stable, blood pressure is stable -history of Left DVT due to injury, was on xarelto for 3 months (discontinued in Sep 2015) -CT chest: Positive for acute PE with evidence of right heart strain consistent with at least some massive PE; thin saddle component with large thromboembolic burden in distal main right PA -PCCM contacted and felt patient to be stable for telemetry -Echocardiogram: EF 55-60%, diastolic function parameters normal -Patient given Lovenox 80mg  SQ by ED -Transition from heparin drip to 0 -Lower extremity Doppler: Right: Acute DVT noted in the CFV, FV, Pop v, gastrocnemius v, and proximal PTV and Pero v. Superficial thrombosis noted in the proximal LSV. 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 -Explained to patient and mother that he will need lifelong anticoagulation -Physical therapy recommended outpatient physical therapy  Nicotine abuse -Smoking cessation counseling given  Neuropathy -Continue lyrica  Hypertension -Continue cardura  Procedures: Echocardiogram: EF 55-60%, diastolic function normal  Consultations: PCCM  Discharge Exam: Filed Vitals:   11/22/14 1015  BP: 124/87  Pulse:   Temp:   Resp:      General: Well developed, well nourished, NAD, appears stated age  HEENT: NCAT, mucous membranes moist.  Cardiovascular: S1 S2 auscultated, RRR  Respiratory: Clear to auscultation bilaterally with equal chest rise  Abdomen: Soft, nontender, nondistended, + bowel sounds  Extremities: warm dry without cyanosis clubbing. RLE edema, erythema-improving and mild  Neuro: AAOx3, no focal deficits  Psych: Appropriate mood and affect  Discharge Instructions      Discharge Instructions    Discharge instructions    Complete by:  As directed   Patient will  be discharged to home. Continue his medications as prescribed. Patient was strongly advised to stop smoking. Patient will need to follow up  with [facility]. Patient should follow a heart healthy diet. Patient may resume activity as tolerated. Patient should seek physical therapy as an outpatient.            Medication List    TAKE these medications        doxazosin 1 MG tablet  Commonly known as:  CARDURA  Take 1 mg by mouth daily.     LYRICA 75 MG capsule  Generic drug:  pregabalin  Take 150 mg by mouth at bedtime.     Rivaroxaban 15 & 20 MG Tbpk  Commonly known as:  XARELTO STARTER PACK  Take as directed on package: Start with one 15mg  tablet by mouth twice a day with food. On Day 22, switch to one 20mg  tablet once a day with food.       No Known Allergies Follow-up Information    Follow up with [facility].   Why:  f/u appointment 11/26/14 at 9:00 please bring $20 for copay, ID, and hospital d/c papers   Contact information:   [address] [facility] [place] [id number] [phone]       The results of significant diagnostics from this hospitalization (including imaging, microbiology, ancillary and laboratory) are listed below for reference.    Significant Diagnostic Studies: Ct Angio Chest Pe W/cm &/or Wo Cm  11/20/2014   CLINICAL DATA:  Right upper chest pain, severe right leg pain. Patient reports recent vascular study with DVT in right leg. History of left leg DVT May 2015.  EXAM: CT ANGIOGRAPHY CHEST WITH CONTRAST  TECHNIQUE: Multidetector CT imaging of the chest was performed using the standard protocol during bolus administration of intravenous contrast. Multiplanar CT image reconstructions and MIPs were obtained to evaluate the vascular anatomy.  CONTRAST:  100mL OMNIPAQUE IOHEXOL 350 MG/ML SOLN  COMPARISON:  None available.  FINDINGS: The examination is positive for pulmonary embolus. There is a thin saddle component with large thromboembolic burden in the distal main right pulmonary artery extending into the lower lobe and all segmental  branches. Clot extends into the right upper lobar and to a lesser extent segmental branches. There is extension into the right middle lobe and medial segmental branch. On the left there is moderate thromboembolic burden involving the left upper and lower lobar arteries as well as all segmental branches. There is evidence of right heart strain with straightening of the intraventricular septum and RV/ LV ratio of 1.5. No consolidation in the lung parenchyma to suggest pulmonary infarct.  The lungs are clear. There is no pleural or pericardial effusion. No enlarged mediastinal, hilar, or axillary lymph nodes. The thoracic aorta is normal in caliber. Minimal atherosclerotic calcifications in the proximal coronary arteries.  Evaluation of the upper abdomen demonstrates diffusely decreased hepatic density consistent with hepatic steatosis. No acute abnormality in the included upper abdomen.  No acute or suspicious osseous abnormality.  Review of the MIP images confirms the above findings.  IMPRESSION: 1. Positive for acute PE with CT evidence of right heartstrain (RV/LV Ratio = 1.5) consistent with at least submassive (intermediate risk)PE. The presence of right heart strain has been associated with an increased risk of morbidity and mortality. Consultation with Pulmonary and Critical Care Medicine is recommended. 2. Incidental finding of hepatic steatosis.  Critical Value/emergent results were called by telephone at the time of  interpretation on 11/20/2014 at 12:52 pm to Dr. Margarita GrizzleANIELLE RAY , who verbally acknowledged these results.   Electronically Signed   By: Rubye OaksMelanie  Ehinger M.D.   On: 11/20/2014 13:02    Microbiology: No results found for this or any previous visit (from the past 240 hour(s)).   Labs: Basic Metabolic Panel:  Recent Labs Lab 11/20/14 1002  NA 139  K 3.6  CL 106  CO2 20  GLUCOSE 87  BUN 7  CREATININE 0.82  CALCIUM 8.3*   Liver Function Tests: No results for input(s): AST, ALT,  ALKPHOS, BILITOT, PROT, ALBUMIN in the last 168 hours. No results for input(s): LIPASE, AMYLASE in the last 168 hours. No results for input(s): AMMONIA in the last 168 hours. CBC:  Recent Labs Lab 11/20/14 1002 11/21/14 0256 11/22/14 0512  WBC 5.9 6.6 5.9  HGB 14.4 13.1 12.7*  HCT 41.6 39.2 37.7*  MCV 95.6 96.3 96.4  PLT 123* 129* 124*   Cardiac Enzymes: No results for input(s): CKTOTAL, CKMB, CKMBINDEX, TROPONINI in the last 168 hours. BNP: BNP (last 3 results) No results for input(s): PROBNP in the last 8760 hours. CBG: No results for input(s): GLUCAP in the last 168 hours.     SignedEdsel Petrin:  Aldin Drees  [facility] 11/22/2014, 1:17 PM

## 2014-11-22 NOTE — Progress Notes (Signed)
Walter Mccarty,Walter Mccarty 10:17 AM   SATURATION QUALIFICATIONS: (This note is used to comply with regulatory documentation for home oxygen)  Patient Saturations on Room Air at Rest = 99%  Patient Saturations on Room Air while Ambulating = 98%

## 2014-11-28 ENCOUNTER — Ambulatory Visit: Payer: Self-pay | Attending: Internal Medicine | Admitting: Internal Medicine

## 2014-11-28 ENCOUNTER — Encounter: Payer: Self-pay | Admitting: Internal Medicine

## 2014-11-28 VITALS — BP 150/84 | HR 68 | Temp 98.4°F | Resp 16 | Wt 168.4 lb

## 2014-11-28 DIAGNOSIS — I10 Essential (primary) hypertension: Secondary | ICD-10-CM | POA: Insufficient documentation

## 2014-11-28 DIAGNOSIS — Z720 Tobacco use: Secondary | ICD-10-CM

## 2014-11-28 DIAGNOSIS — G629 Polyneuropathy, unspecified: Secondary | ICD-10-CM | POA: Insufficient documentation

## 2014-11-28 DIAGNOSIS — Z139 Encounter for screening, unspecified: Secondary | ICD-10-CM

## 2014-11-28 DIAGNOSIS — I2692 Saddle embolus of pulmonary artery without acute cor pulmonale: Secondary | ICD-10-CM | POA: Insufficient documentation

## 2014-11-28 DIAGNOSIS — Z86718 Personal history of other venous thrombosis and embolism: Secondary | ICD-10-CM | POA: Insufficient documentation

## 2014-11-28 DIAGNOSIS — I82403 Acute embolism and thrombosis of unspecified deep veins of lower extremity, bilateral: Secondary | ICD-10-CM

## 2014-11-28 DIAGNOSIS — I82401 Acute embolism and thrombosis of unspecified deep veins of right lower extremity: Secondary | ICD-10-CM | POA: Insufficient documentation

## 2014-11-28 DIAGNOSIS — Z833 Family history of diabetes mellitus: Secondary | ICD-10-CM | POA: Insufficient documentation

## 2014-11-28 DIAGNOSIS — Z87891 Personal history of nicotine dependence: Secondary | ICD-10-CM | POA: Insufficient documentation

## 2014-11-28 DIAGNOSIS — F1910 Other psychoactive substance abuse, uncomplicated: Secondary | ICD-10-CM

## 2014-11-28 DIAGNOSIS — I2699 Other pulmonary embolism without acute cor pulmonale: Secondary | ICD-10-CM

## 2014-11-28 DIAGNOSIS — Z7901 Long term (current) use of anticoagulants: Secondary | ICD-10-CM | POA: Insufficient documentation

## 2014-11-28 LAB — LIPID PANEL
CHOL/HDL RATIO: 3.8 ratio
Cholesterol: 237 mg/dL — ABNORMAL HIGH (ref 0–200)
HDL: 63 mg/dL (ref >39)
LDL Cholesterol: 158 mg/dL — ABNORMAL HIGH (ref 0–99)
Triglycerides: 82 mg/dL (ref <150)
VLDL: 16 mg/dL (ref 0–40)

## 2014-11-28 NOTE — Progress Notes (Signed)
Patient here for follow up from hospital Was diagnosed with blood clots to his legs and lungs Currently on xarelto 15mg 

## 2014-11-28 NOTE — Patient Instructions (Signed)
DASH Eating Plan °DASH stands for "Dietary Approaches to Stop Hypertension." The DASH eating plan is a healthy eating plan that has been shown to reduce high blood pressure (hypertension). Additional health benefits may include reducing the risk of type 2 diabetes mellitus, heart disease, and stroke. The DASH eating plan may also help with weight loss. °WHAT DO I NEED TO KNOW ABOUT THE DASH EATING PLAN? °For the DASH eating plan, you will follow these general guidelines: °· Choose foods with a percent daily value for sodium of less than 5% (as listed on the food label). °· Use salt-free seasonings or herbs instead of table salt or sea salt. °· Check with your health care provider or pharmacist before using salt substitutes. °· Eat lower-sodium products, often labeled as "lower sodium" or "no salt added." °· Eat fresh foods. °· Eat more vegetables, fruits, and low-fat dairy products. °· Choose whole grains. Look for the word "whole" as the first word in the ingredient list. °· Choose fish and skinless chicken or turkey more often than red meat. Limit fish, poultry, and meat to 6 oz (170 g) each day. °· Limit sweets, desserts, sugars, and sugary drinks. °· Choose heart-healthy fats. °· Limit cheese to 1 oz (28 g) per day. °· Eat more home-cooked food and less restaurant, buffet, and fast food. °· Limit fried foods. °· Cook foods using methods other than frying. °· Limit canned vegetables. If you do use them, rinse them well to decrease the sodium. °· When eating at a restaurant, ask that your food be prepared with less salt, or no salt if possible. °WHAT FOODS CAN I EAT? °Seek help from a dietitian for individual calorie needs. °Grains °Whole grain or whole wheat bread. Brown rice. Whole grain or whole wheat pasta. Quinoa, bulgur, and whole grain cereals. Low-sodium cereals. Corn or whole wheat flour tortillas. Whole grain cornbread. Whole grain crackers. Low-sodium crackers. °Vegetables °Fresh or frozen vegetables  (raw, steamed, roasted, or grilled). Low-sodium or reduced-sodium tomato and vegetable juices. Low-sodium or reduced-sodium tomato sauce and paste. Low-sodium or reduced-sodium canned vegetables.  °Fruits °All fresh, canned (in natural juice), or frozen fruits. °Meat and Other Protein Products °Ground beef (85% or leaner), grass-fed beef, or beef trimmed of fat. Skinless chicken or turkey. Ground chicken or turkey. Pork trimmed of fat. All fish and seafood. Eggs. Dried beans, peas, or lentils. Unsalted nuts and seeds. Unsalted canned beans. °Dairy °Low-fat dairy products, such as skim or 1% milk, 2% or reduced-fat cheeses, low-fat ricotta or cottage cheese, or plain low-fat yogurt. Low-sodium or reduced-sodium cheeses. °Fats and Oils °Tub margarines without trans fats. Light or reduced-fat mayonnaise and salad dressings (reduced sodium). Avocado. Safflower, olive, or canola oils. Natural peanut or almond butter. °Other °Unsalted popcorn and pretzels. °The items listed above may not be a complete list of recommended foods or beverages. Contact your dietitian for more options. °WHAT FOODS ARE NOT RECOMMENDED? °Grains °White bread. White pasta. White rice. Refined cornbread. Bagels and croissants. Crackers that contain trans fat. °Vegetables °Creamed or fried vegetables. Vegetables in a cheese sauce. Regular canned vegetables. Regular canned tomato sauce and paste. Regular tomato and vegetable juices. °Fruits °Dried fruits. Canned fruit in light or heavy syrup. Fruit juice. °Meat and Other Protein Products °Fatty cuts of meat. Ribs, chicken wings, bacon, sausage, bologna, salami, chitterlings, fatback, hot dogs, bratwurst, and packaged luncheon meats. Salted nuts and seeds. Canned beans with salt. °Dairy °Whole or 2% milk, cream, half-and-half, and cream cheese. Whole-fat or sweetened yogurt. Full-fat   cheeses or blue cheese. Nondairy creamers and whipped toppings. Processed cheese, cheese spreads, or cheese  curds. °Condiments °Onion and garlic salt, seasoned salt, table salt, and sea salt. Canned and packaged gravies. Worcestershire sauce. Tartar sauce. Barbecue sauce. Teriyaki sauce. Soy sauce, including reduced sodium. Steak sauce. Fish sauce. Oyster sauce. Cocktail sauce. Horseradish. Ketchup and mustard. Meat flavorings and tenderizers. Bouillon cubes. Hot sauce. Tabasco sauce. Marinades. Taco seasonings. Relishes. °Fats and Oils °Butter, stick margarine, lard, shortening, ghee, and bacon fat. Coconut, palm kernel, or palm oils. Regular salad dressings. °Other °Pickles and olives. Salted popcorn and pretzels. °The items listed above may not be a complete list of foods and beverages to avoid. Contact your dietitian for more information. °WHERE CAN I FIND MORE INFORMATION? °National Heart, Lung, and Blood Institute: www.nhlbi.nih.gov/health/health-topics/topics/dash/ °Document Released: 11/05/2011 Document Revised: 04/02/2014 Document Reviewed: 09/20/2013 °ExitCare® Patient Information ©2015 [employer]. This information is not intended to replace advice given to you by your health care provider. Make sure you discuss any questions you have with your health care provider. ° °

## 2014-11-28 NOTE — Progress Notes (Signed)
Patient Demographics  Walter LeydenJoel Lecy, is a 43 y.o. male  ZOX:[id number]  WJX:[id number]  DOB - 07/27/1971  CC:  Chief Complaint  Patient presents with  . Hospitalization Follow-up       HPI: Walter Mccarty is a 43 y.o. 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 As per patient he is getting these medications from his workers comp physician, today's blood pressure is borderline elevated, denies any headache dizziness chest and shortness of breath, have advised patient for DASH diet, since he has been discharged from the hospital he does not smoke cigarettes. Patient has No headache, No chest pain, No abdominal pain - No Nausea, No new weakness tingling or numbness, No Cough - SOB.  No Known Allergies Past Medical History  Diagnosis Date  . H/O blood clots   . Hypertension     stated that he has hx of HTN but they d/c the bp medication because BP was fine.    Current Outpatient Prescriptions on File Prior to Visit  Medication Sig Dispense Refill  . doxazosin (CARDURA) 1 MG tablet Take 1 mg by mouth daily.  2  . LYRICA 75 MG capsule Take 150 mg by mouth at bedtime.  1  .  Rivaroxaban (XARELTO STARTER PACK) 15 & 20 MG TBPK Take as directed on package: Start with one 15mg  tablet by mouth twice a day with food. On Day 22, switch to one 20mg  tablet once a day with food. 51 each 0   No current facility-administered medications on file prior to visit.   Family History  Problem Relation Age of Onset  . Diabetes Mother   . Depression Maternal Grandmother    History   Social History  . Marital Status: Single    Spouse Name: N/A    Number of Children: N/A  . Years of Education: N/A   Occupational History  . Not on file.   Social History Main Topics  . Smoking status: Former Smoker -- 1.50 packs/day    Types: Cigarettes  . Smokeless tobacco: Not on file  . Alcohol Use: No  . Drug Use: No  . Sexual Activity: Not on file   Other Topics Concern  . Not on file   Social History Narrative    Review of Systems: Constitutional: Negative for fever, chills, diaphoresis, activity change, appetite change and fatigue. HENT: Negative for ear pain, nosebleeds, congestion, facial swelling, rhinorrhea, neck pain, neck stiffness and ear discharge.  Eyes: Negative for pain, discharge, redness, itching and visual disturbance. Respiratory: Negative for cough, choking, chest tightness, shortness of breath, wheezing and stridor.  Cardiovascular: Negative for chest pain, palpitations and leg swelling. Gastrointestinal: Negative for abdominal distention. Genitourinary: Negative for dysuria,  urgency, frequency, hematuria, flank pain, decreased urine volume, difficulty urinating and dyspareunia.  Musculoskeletal: Negative for back pain, joint swelling, arthralgia and gait problem. Neurological: Negative for dizziness, tremors, seizures, syncope, facial asymmetry, speech difficulty, weakness, light-headedness, numbness and headaches.  Hematological: Negative for adenopathy. Does not bruise/bleed easily. Psychiatric/Behavioral: Negative for hallucinations, behavioral problems,  confusion, dysphoric mood, decreased concentration and agitation.    Objective:   Filed Vitals:   11/28/14 1004  BP: 150/84  Pulse: 68  Temp: 98.4 F (36.9 C)  Resp: 16    Physical Exam: Constitutional: Patient appears well-developed and well-nourished. No distress. HENT: Normocephalic, atraumatic, External right and left ear normal. Oropharynx is clear and moist.  Eyes: Conjunctivae and EOM are normal. PERRLA, no scleral icterus. Neck: Normal ROM. Neck supple. No JVD. No tracheal deviation. No thyromegaly. CVS: RRR, S1/S2 +, no murmurs, no gallops, no carotid bruit.  Pulmonary: Effort and breath sounds normal, no stridor, rhonchi, wheezes, rales.  Abdominal: Soft. BS +, no distension, tenderness, rebound or guarding.  Musculoskeletal: Normal range of motion. No edema and no tenderness. Right leg swollen no tenderness   Neuro: Alert. Normal reflexes, muscle tone coordination. No cranial nerve deficit. Skin: Skin is warm and dry. No rash noted. Not diaphoretic. No erythema. No pallor. Psychiatric: Normal mood and affect. Behavior, judgment, thought content normal.  Lab Results  Component Value Date   WBC 5.9 11/22/2014   HGB 12.7* 11/22/2014   HCT 37.7* 11/22/2014   MCV 96.4 11/22/2014   PLT 124* 11/22/2014   Lab Results  Component Value Date   CREATININE 0.82 11/20/2014   BUN 7 11/20/2014   NA 139 11/20/2014   K 3.6 11/20/2014   CL 106 11/20/2014   CO2 20 11/20/2014    No results found for: HGBA1C Lipid Panel  No results found for: CHOL, TRIG, HDL, CHOLHDL, VLDL, LDLCALC     Assessment and plan:   1. Saddle pulmonary embolus/2. DVT (deep venous thrombosis), bilateral Patient will be on lifelong anti-coagulation.. Currently patient is on Xarelto.  3. Nicotine abuse Since the hospital discharge patient has not smoked   4. Essential hypertension Currently on cardura  Advised patient for DASH diet.  5. Neuropathy Takes lyrica follows up with workers comp  physician   6. Screening Ordered baseline blood work. - TSH - Lipid panel - Vit D  25 hydroxy (rtn osteoporosis monitoring)  7. Family history of diabetes mellitus (DM)  - Hemoglobin A1c  Health Maintenance  -Vaccinations:  as per patient he will get the flu vaccine at the pharmacy   Return in about 3 months (around 02/27/2015).   Doris CheadleADVANI, Almendra Loria, MD

## 2014-11-29 ENCOUNTER — Telehealth: Payer: Self-pay | Admitting: Internal Medicine

## 2014-11-29 ENCOUNTER — Telehealth: Payer: Self-pay

## 2014-11-29 LAB — VITAMIN D 25 HYDROXY (VIT D DEFICIENCY, FRACTURES): Vit D, 25-Hydroxy: 8 ng/mL — ABNORMAL LOW (ref 30–100)

## 2014-11-29 LAB — HEMOGLOBIN A1C
Hgb A1c MFr Bld: 5.4 % (ref <5.7)
MEAN PLASMA GLUCOSE: 108 mg/dL (ref <117)

## 2014-11-29 LAB — TSH: TSH: 2.474 u[IU]/mL (ref 0.350–4.500)

## 2014-11-29 MED ORDER — VITAMIN D (ERGOCALCIFEROL) 1.25 MG (50000 UNIT) PO CAPS: 50000.0000 [IU] | ORAL_CAPSULE | ORAL | Status: AC

## 2014-11-29 NOTE — Telephone Encounter (Signed)
-----   Message from Doris Cheadleeepak Advani, MD sent at 11/29/2014  9:25 AM EST ----- Blood work reviewed, noticed low vitamin D, call patient advise to start ergocalciferol 50,000 units once a week for the duration of  12 weeks. Also noticed Elevated cholesterol, advise patient for low fat diet.

## 2014-11-29 NOTE — Telephone Encounter (Signed)
Patient not available Message left on machine to return our call Prescription sent to pharmacy on file

## 2014-11-29 NOTE — Telephone Encounter (Signed)
Pt is requesting a note from dr. Orpah CobbAdvani. Pt forgot to mention this yesterday on his visit but he needs a note for his physical therapist to be cleared in order to continue his physicial therapy or not. Patient is willing to come pick up the note. He says it can wait till Monday. Please follow up with pt.

## 2014-12-10 ENCOUNTER — Telehealth: Payer: Self-pay | Admitting: Internal Medicine

## 2014-12-10 NOTE — Telephone Encounter (Signed)
Patient it calling to check on status of note he requested for his physical therapy. Patient is also calling to request blood work results.

## 2014-12-10 NOTE — Telephone Encounter (Signed)
Pt aware of lab resutls, Rx was send on 11/11/2014 to [facility]   Notes Recorded by Doris Cheadleeepak Advani, MD on 11/29/2014 at 9:25 AM Blood work reviewed, noticed low vitamin D, call patient advise to start ergocalciferol 50,000 units once a week for the duration of 12 weeks. Also noticed Elevated cholesterol, advise patient for low fat diet.

## 2014-12-11 ENCOUNTER — Telehealth: Payer: Self-pay | Admitting: Internal Medicine

## 2014-12-11 NOTE — Telephone Encounter (Signed)
Patient called to request a letter for his place of work, patient would like to speak to nurse for details of the letter he needs. Please f/u with pt.

## 2015-01-02 ENCOUNTER — Ambulatory Visit: Payer: Self-pay | Admitting: Internal Medicine

## 2015-01-23 ENCOUNTER — Ambulatory Visit: Payer: Self-pay | Admitting: Internal Medicine

## 2015-02-28 ENCOUNTER — Ambulatory Visit: Payer: Self-pay | Attending: Internal Medicine | Admitting: Internal Medicine

## 2015-02-28 ENCOUNTER — Encounter: Payer: Self-pay | Admitting: Internal Medicine

## 2015-02-28 VITALS — BP 144/99 | HR 93 | Temp 97.8°F | Resp 16 | Wt 157.0 lb

## 2015-02-28 DIAGNOSIS — E7800 Pure hypercholesterolemia, unspecified: Secondary | ICD-10-CM | POA: Insufficient documentation

## 2015-02-28 DIAGNOSIS — I82403 Acute embolism and thrombosis of unspecified deep veins of lower extremity, bilateral: Secondary | ICD-10-CM

## 2015-02-28 DIAGNOSIS — I10 Essential (primary) hypertension: Secondary | ICD-10-CM | POA: Insufficient documentation

## 2015-02-28 DIAGNOSIS — I82409 Acute embolism and thrombosis of unspecified deep veins of unspecified lower extremity: Secondary | ICD-10-CM | POA: Insufficient documentation

## 2015-02-28 DIAGNOSIS — E559 Vitamin D deficiency, unspecified: Secondary | ICD-10-CM | POA: Insufficient documentation

## 2015-02-28 NOTE — Patient Instructions (Signed)

## 2015-02-28 NOTE — Progress Notes (Signed)
MRN: [id number] Name: Walter Mccarty  Sex: male Age: 44 y.o. DOB: 02/13/1971  Allergies: Review of patient's allergies indicates no known allergies.  Chief Complaint  Patient presents with  . Follow-up    HPI: Patient is 44 y.o. male who has to of bilateral light  DVT as well as PE, history of hypertension and is taking Cardura today's blood pressure is borderline elevated denies any headache dizziness chest and shortness of breath, also has been following up with his workers comp doctor who treats him with Lyrica, patient reported that last year he was following up with physical therapy currently would like to know if he can go back since it is recommended by his workers comp doctor, patient reported when he stopped taking Xarelto then he developed another blood clot but currently is taking Xarelto regularly.patient had a blood work done which was reviewed with him noticed elevated cholesterol level patient is advised for low-fat diet.  Past Medical History  Diagnosis Date  . H/O blood clots   . Hypertension     stated that he has hx of HTN but they d/c the bp medication because BP was fine.     History reviewed. No pertinent past surgical history.    Medication List       This list is accurate as of: 02/28/15  1:14 PM.  Always use your most recent med list.               doxazosin 1 MG tablet  Commonly known as:  CARDURA  Take 1 mg by mouth daily.     LYRICA 75 MG capsule  Generic drug:  pregabalin  Take 150 mg by mouth at bedtime.     Rivaroxaban 15 & 20 MG Tbpk  Commonly known as:  XARELTO STARTER PACK  Take as directed on package: Start with one 15mg  tablet by mouth twice a day with food. On Day 22, switch to one 20mg  tablet once a day with food.     Vitamin D (Ergocalciferol) 50000 UNITS Caps capsule  Commonly known as:  DRISDOL  Take 1 capsule (50,000 Units total) by mouth every 7 (seven) days.        No orders of the defined types were placed in this  encounter.    There is no immunization history for the selected administration types on file for this patient.  Family History  Problem Relation Age of Onset  . Diabetes Mother   . Depression Maternal Grandmother     History  Substance Use Topics  . Smoking status: Former Smoker -- 1.50 packs/day    Types: Cigarettes  . Smokeless tobacco: Not on file  . Alcohol Use: No    Review of Systems   As noted in HPI  Filed Vitals:   02/28/15 1204  BP: 144/99  Pulse: 93  Temp: 97.8 F (36.6 C)  Resp: 16    Physical Exam  Physical Exam  Eyes: EOM are normal. Pupils are equal, round, and reactive to light.  Cardiovascular: Normal rate and regular rhythm.   Pulmonary/Chest: Breath sounds normal. No respiratory distress. He has no wheezes. He has no rales.  Musculoskeletal:  Bilateral legs no erythema or swelling    CBC    Component Value Date/Time   WBC 5.9 11/22/2014 0512   RBC 3.91* 11/22/2014 0512   HGB 12.7* 11/22/2014 0512   HCT 37.7* 11/22/2014 0512   PLT 124* 11/22/2014 0512   MCV 96.4 11/22/2014 0512    CMP  Component Value Date/Time   NA 139 11/20/2014 1002   K 3.6 11/20/2014 1002   CL 106 11/20/2014 1002   CO2 20 11/20/2014 1002   GLUCOSE 87 11/20/2014 1002   BUN 7 11/20/2014 1002   CREATININE 0.82 11/20/2014 1002   CALCIUM 8.3* 11/20/2014 1002   GFRNONAA >90 11/20/2014 1002   GFRAA >90 11/20/2014 1002    Lab Results  Component Value Date/Time   CHOL 237* 11/28/2014 10:39 AM    No components found for: HGA1C  No results found for: AST  Assessment and Plan  Essential hypertension Blood pressure is borderline elevated, advise patient for DASH diet continue with Cardura  Vitamin D deficiency Patient will  start taking over-the-counter vitamin D 2000 units daily  High cholesterol Advised patient for low fat diet.  DVT (deep venous thrombosis), bilateral/PE Currently patient is on Xarelto and likely continue for  lifelong.    Return in about 4 months (around 06/30/2015), or if symptoms worsen or fail to improve, for hypertension, hyperipidemia.   This note has been created with Education officer, environmental. Any transcriptional errors are unintentional.    Doris Cheadle, MD

## 2015-02-28 NOTE — Progress Notes (Signed)
Patient here for follow up on his back pain and blood clots Requesting to go back to physical therapy for his back injury workmans comp nurse is questioning whether or not patients blood Clots are associated with his injury last year

## 2015-10-01 DEATH — deceased

## 2016-06-01 IMAGING — CT CT ANGIO CHEST
2 of 8 series · 18 of 36 positions shown · IV contrast (Iohexol (Omnipaque 350))
Comparison: None available.

CLINICAL DATA: Right upper chest pain, severe right leg pain.
Patient reports recent vascular study with DVT in right leg. History
of left leg DVT March 2014.

EXAM:
CT ANGIOGRAPHY CHEST WITH CONTRAST
TECHNIQUE: Multidetector CT imaging of the chest was performed using the
standard protocol during bolus administration of intravenous
contrast. Multiplanar CT image reconstructions and MIPs were
obtained to evaluate the vascular anatomy.
CONTRAST:  100mL OMNIPAQUE IOHEXOL 350 MG/ML SOLN

[Series 403: thins pacs · axial · 0.61mm/px · z∈[-298,-37]mm · 17 of 295 slices shown]
[im 17/295  lung]
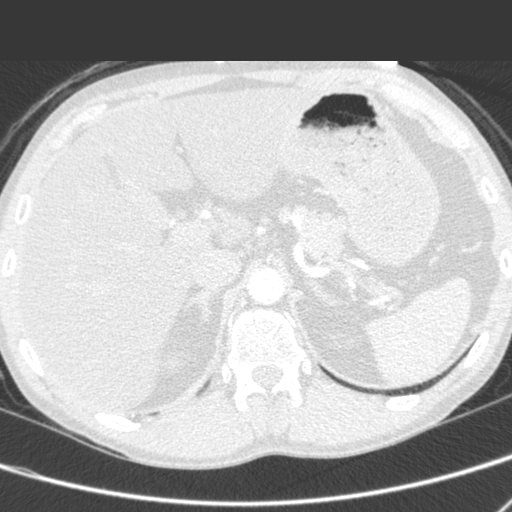
[im 33/295  mediastinal]
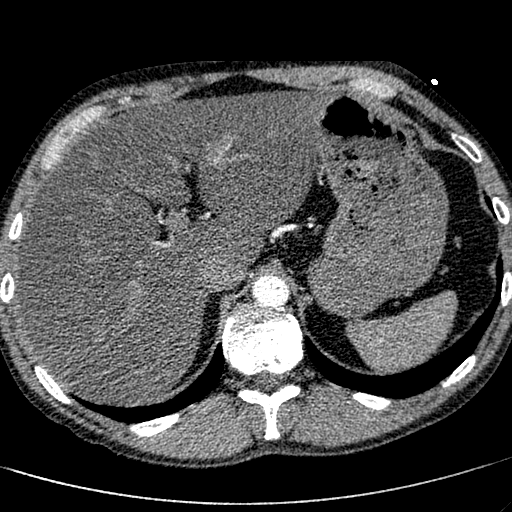
[im 50/295  lung]
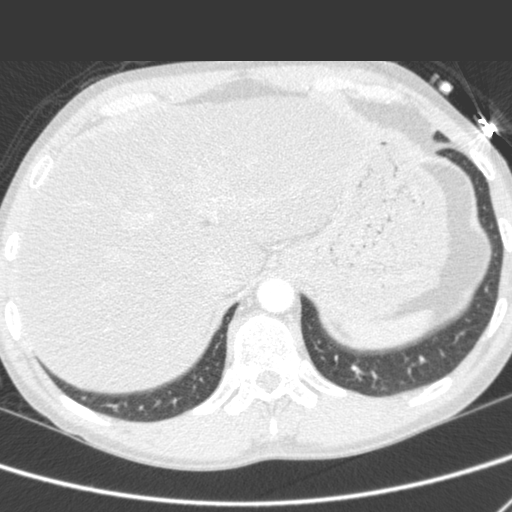
[im 66/295  mediastinal]
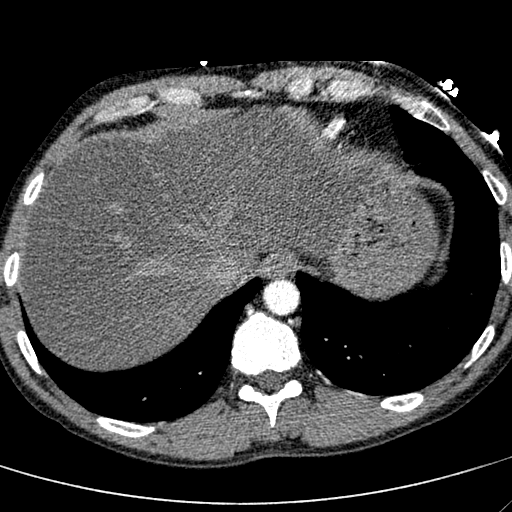
[im 82/295  lung]
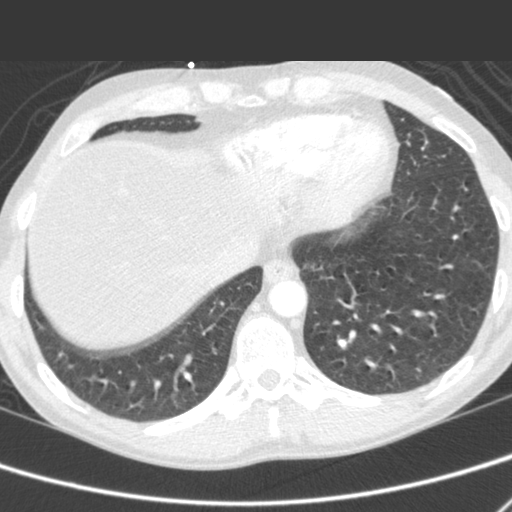
[im 99/295  mediastinal]
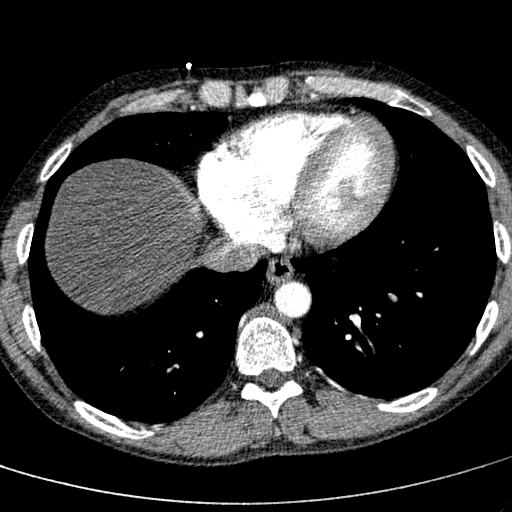
[im 115/295  lung]
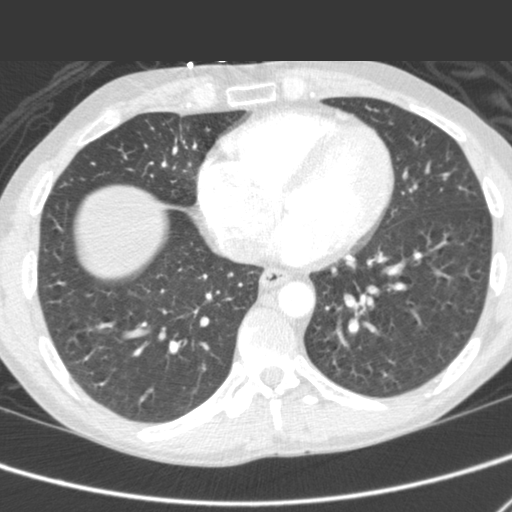
[im 131/295  mediastinal]
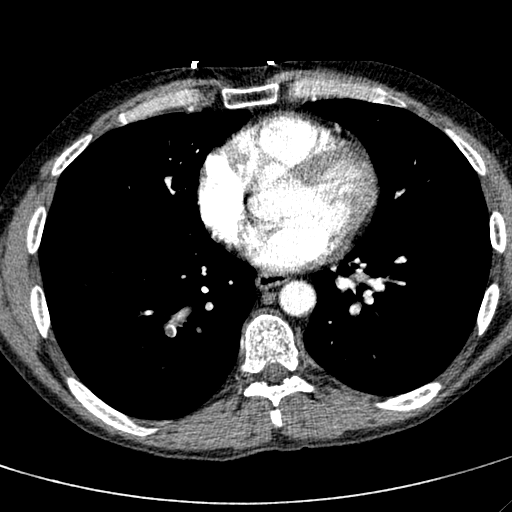
[im 148/295  lung]
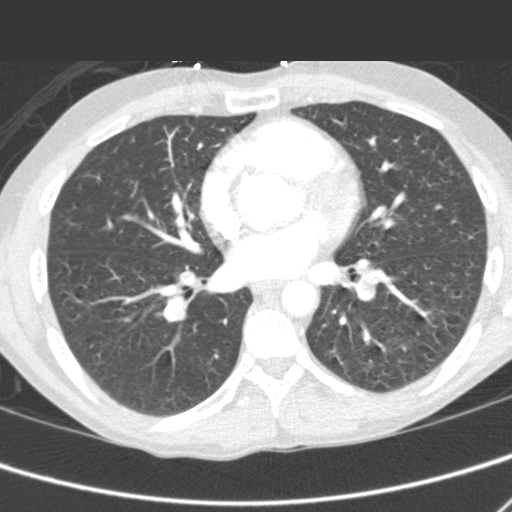
[im 164/295  mediastinal]
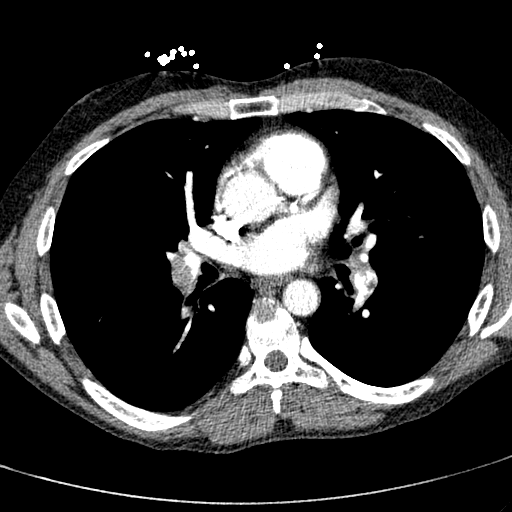
[im 180/295  lung]
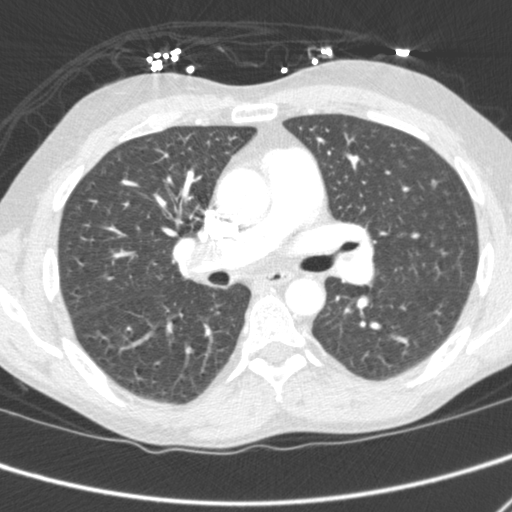
[im 197/295  mediastinal]
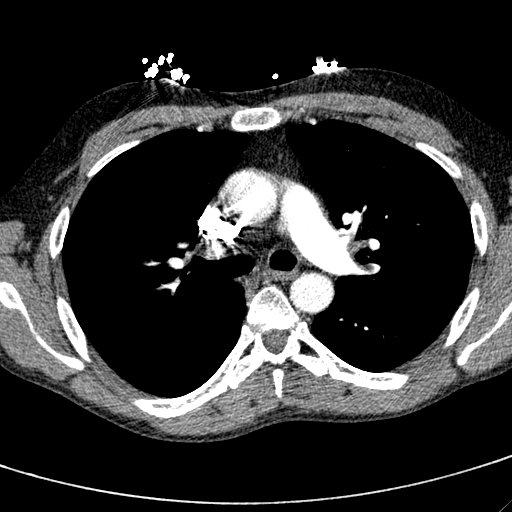
[im 213/295  lung]
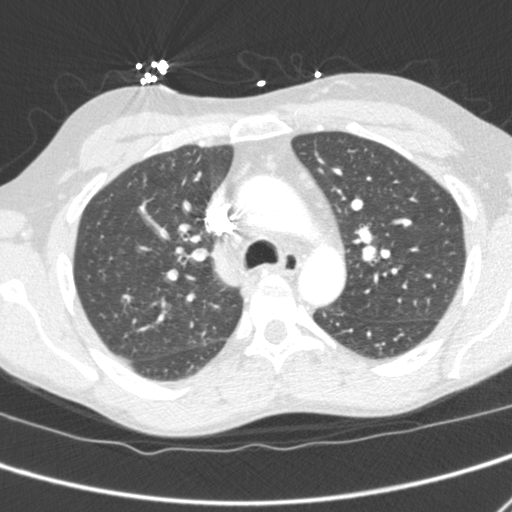
[im 229/295  mediastinal]
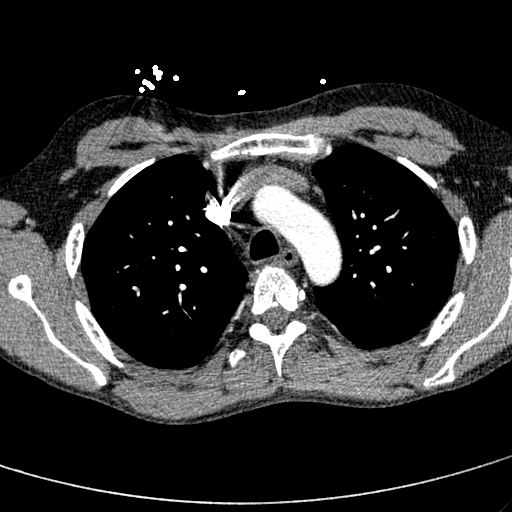
[im 245/295  lung]
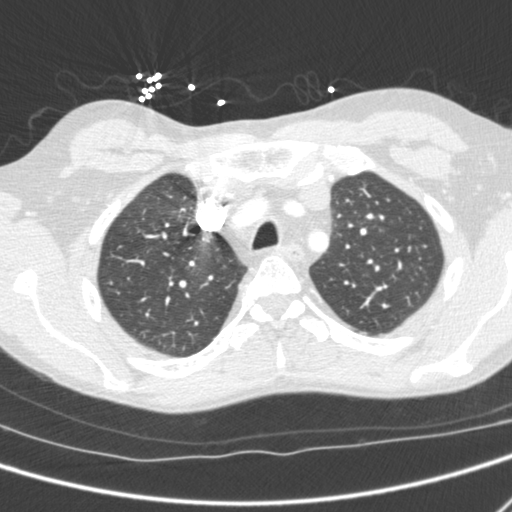
[im 262/295  mediastinal]
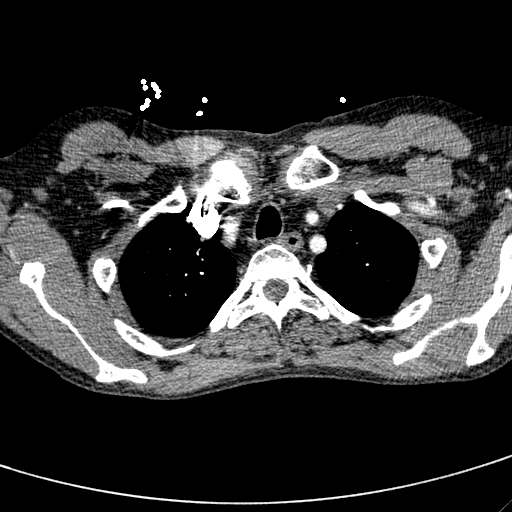
[im 278/295  lung]
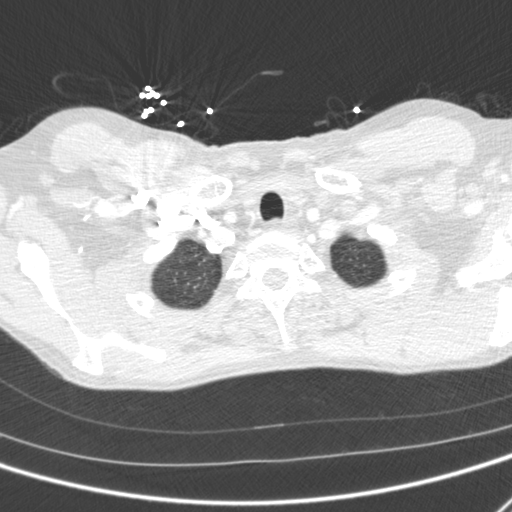

[Series 405: coronal mpr · coronal · 0.62mm/px · 1 of 75 slices shown]
[im 38/75  mediastinal]
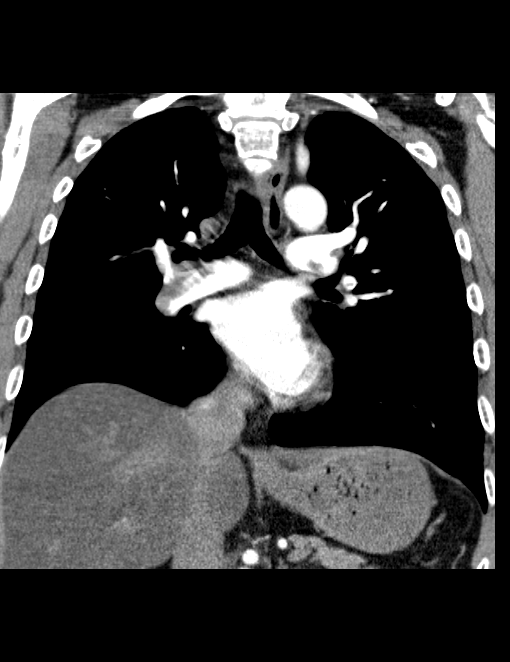

[18 of 36 positions shown; findings below may reference images not displayed]

FINDINGS: The examination is positive for pulmonary embolus. There is a thin
saddle component with large thromboembolic burden in the distal main
right pulmonary artery extending into the lower lobe and all
segmental branches. Clot extends into the right upper lobar and to a
lesser extent segmental branches. There is extension into the right
middle lobe and medial segmental branch. On the left there is
moderate thromboembolic burden involving the left upper and lower
lobar arteries as well as all segmental branches. There is evidence
of right heart strain with straightening of the intraventricular
septum and RV/ LV ratio of 1.5. No consolidation in the lung
parenchyma to suggest pulmonary infarct.

The lungs are clear. There is no pleural or pericardial effusion. No
enlarged mediastinal, hilar, or axillary lymph nodes. The thoracic
aorta is normal in caliber. Minimal atherosclerotic calcifications
in the proximal coronary arteries.

Evaluation of the upper abdomen demonstrates diffusely decreased
hepatic density consistent with hepatic steatosis. No acute
abnormality in the included upper abdomen.

No acute or suspicious osseous abnormality.

Review of the MIP images confirms the above findings.
IMPRESSION: 1. Positive for acute PE with CT evidence of right heartstrain
(RV/LV Ratio = 1.5) consistent with at least submassive
(intermediate risk)PE. The presence of right heart strain has been
associated with an increased risk of morbidity and mortality.
Consultation with Pulmonary and [REDACTED] is
recommended.
2. Incidental finding of hepatic steatosis.

Critical Value/emergent results were called by telephone at the time
of interpretation on 11/20/2014 at [DATE] to Dr. KNUT-ARNE SYLVAN ,
who verbally acknowledged these results.
